# Patient Record
Sex: Male | Born: 1990 | Race: White | Hispanic: No | Marital: Married
Health system: Southern US, Community
[De-identification: ages and names within clinical notes are randomized; demographics above are authoritative.]

---

## 2010-07-27 ENCOUNTER — Ambulatory Visit: Payer: Self-pay | Admitting: General Surgery

## 2011-02-26 ENCOUNTER — Encounter: Payer: Self-pay | Admitting: Family Medicine

## 2011-02-26 ENCOUNTER — Ambulatory Visit
Admission: RE | Admit: 2011-02-26 | Discharge: 2011-02-26 | Disposition: A | Discharge status: Home / Self Care | Payer: Self-pay | Source: Ambulatory Visit | Attending: Family Medicine | Admitting: Family Medicine

## 2011-02-26 ENCOUNTER — Other Ambulatory Visit: Payer: Self-pay | Admitting: Family Medicine

## 2011-02-26 ENCOUNTER — Inpatient Hospital Stay (INDEPENDENT_AMBULATORY_CARE_PROVIDER_SITE_OTHER)
Admission: RE | Admit: 2011-02-26 | Discharge: 2011-02-26 | Disposition: A | Discharge status: Home / Self Care | Payer: Self-pay | Source: Ambulatory Visit | Attending: Family Medicine | Admitting: Family Medicine

## 2011-02-26 DIAGNOSIS — K219 Gastro-esophageal reflux disease without esophagitis: Secondary | ICD-10-CM | POA: Insufficient documentation

## 2011-02-26 DIAGNOSIS — S93609A Unspecified sprain of unspecified foot, initial encounter: Secondary | ICD-10-CM

## 2011-02-26 DIAGNOSIS — S93409A Sprain of unspecified ligament of unspecified ankle, initial encounter: Secondary | ICD-10-CM

## 2011-02-26 DIAGNOSIS — S99929A Unspecified injury of unspecified foot, initial encounter: Secondary | ICD-10-CM

## 2011-02-26 DIAGNOSIS — F3289 Other specified depressive episodes: Secondary | ICD-10-CM | POA: Insufficient documentation

## 2011-02-26 DIAGNOSIS — F329 Major depressive disorder, single episode, unspecified: Secondary | ICD-10-CM | POA: Insufficient documentation

## 2011-02-26 DIAGNOSIS — S99919A Unspecified injury of unspecified ankle, initial encounter: Secondary | ICD-10-CM

## 2011-02-28 ENCOUNTER — Telehealth (INDEPENDENT_AMBULATORY_CARE_PROVIDER_SITE_OTHER): Payer: Self-pay | Admitting: Emergency Medicine

## 2011-10-18 NOTE — Letter (Signed)
Summary: Out of Work  [facility]  [address]   [place], [place] [address]   Phone: [phone]  Fax: [phone]    February 26, 2011   Employee:  Cody Huerta    To Whom It May Concern:   For Medical reasons, please excuse the above named employee from work through 03/07/11.      If you need additional information, please feel free to contact our office.         Sincerely,    Donna Christen MD

## 2011-10-18 NOTE — Progress Notes (Signed)
Summary: LEFT FOOT POSSIBLE FRATURE? Room 5   Vital Signs:  Patient Profile:   20 Years Old Male CC:      Left foot injury after jumping in dumpster on Wednesday Height:     76 inches Weight:      160 pounds O2 Sat:      99 % O2 treatment:    Room Air Temp:     97.8 degrees F oral Pulse rate:   73 / minute Pulse rhythm:   regular Resp:     16 per minute BP sitting:   124 / 81  (left arm) Cuff size:   regular  Pt. in pain?   yes    Location:   foot    Intensity:   9    Type:       sharp  Vitals Entered By: Emilio Math (February 26, 2011 11:40 AM)                   Current Allergies: ! SULFAHistory of Present Illness Chief Complaint: Left foot injury after jumping in dumpster on Wednesday History of Present Illness:  Subjective:  Patient complains of pain in his left foot and ankle after twisting his foot on a board.  He has pain with weight bearing  REVIEW OF SYSTEMS Constitutional Symptoms      Denies fever, chills, night sweats, weight loss, weight gain, and fatigue.  Eyes       Denies change in vision, eye pain, eye discharge, glasses, contact lenses, and eye surgery. Ear/Nose/Throat/Mouth       Denies hearing loss/aids, change in hearing, ear pain, ear discharge, dizziness, frequent runny nose, frequent nose bleeds, sinus problems, sore throat, hoarseness, and tooth pain or bleeding.  Respiratory       Denies dry cough, productive cough, wheezing, shortness of breath, asthma, bronchitis, and emphysema/COPD.  Cardiovascular       Denies murmurs, chest pain, and tires easily with exhertion.    Gastrointestinal       Denies stomach pain, nausea/vomiting, diarrhea, constipation, blood in bowel movements, and indigestion. Genitourniary       Denies painful urination, kidney stones, and loss of urinary control. Neurological       Denies paralysis, seizures, and fainting/blackouts. Musculoskeletal       Complains of muscle pain, joint pain, joint stiffness,  decreased range of motion, and swelling.      Denies redness, muscle weakness, and gout.  Skin       Denies bruising, unusual mles/lumps or sores, and hair/skin or nail changes.  Psych       Denies mood changes, temper/anger issues, anxiety/stress, speech problems, depression, and sleep problems.  Past History:  Past Medical History: Hearing loss Depression GERD  Past Surgical History: Gastroschesis repair  Family History: Father, D, Heart Disease, Depression, HTN, Diabetes Mother, Depression  Social History: Non smoker No ETOH No DRugs Works for [employer], Consulting civil engineer   Objective:  Appearance:  Patient appears healthy, stated age, and in no acute distress  Left ankle:  Decreased range of motion.  Tenderness and mild swelling over the lateral malleolus.  Joint stable.  There is mild tenderness over the base of the fifth  metatarsal.   Left foot:  Decreased range of motion toes.  No deformity or swelling.  Mild tenderness over distal metatarsals.  Decreased sensation to touch but good cap refill. Left foot X-ray:  Negative Left ankle X-ray:  Negative Assessment New Problems: FOOT SPRAIN, LEFT (ICD-845.10) ANKLE SPRAIN,  LEFT (ICD-845.00) ANKLE INJURY, LEFT (ICD-959.7) FOOT INJURY, LEFT (ICD-959.7) GERD (ICD-530.81) DEPRESSION (ICD-311)   Plan New Orders: T-DG Ankle Complete*L* [73610] T-DG Foot Complete*L* [73630] New Patient Level III [99203] Aircast Ankle Brace [L4350] Ace Wraps 3-5 in/yard  [A6449] Crutches [E0110] Crutches fitting and training [97760] Planning Comments:   Continue to apply ice pack for 30 minutes every 1 to 2 hours today and tomorrow.  Elevate.  Use crutches for 3 to 5 days.  Wear Ace wrap until swelling decreases.  Wear brace for about 2 to 3 weeks.  Begin exercises in about 5 days as per instruction sheet ([facility] information and instruction patient handoust given)  [facility] with [facility] if not improving 10 to 14 days.   The  patient and/or caregiver has been counseled thoroughly with regard to medications prescribed including dosage, schedule, interactions, rationale for use, and possible side effects and they verbalize understanding.  Diagnoses and expected course of recovery discussed and will return if not improved as expected or if the condition worsens. Patient and/or caregiver verbalized understanding.   Orders Added: 1)  T-DG Ankle Complete*L* [73610] 2)  T-DG Foot Complete*L* [73630] 3)  New Patient Level III [16109] 4)  Aircast Ankle Brace [L4350] 5)  Ace Wraps 3-5 in/yard  [A6449] 6)  Crutches [E0110] 7)  Crutches fitting and training [phone]

## 2011-10-18 NOTE — Telephone Encounter (Signed)
  Phone Note Outgoing Call Call back at [facility] Phone [phone]   Call placed by: Emilio Math,  February 28, 2011 3:22 PM Call placed to: Patient Summary of Call: Left msg hope his ankle and foot are better

## 2015-07-10 ENCOUNTER — Other Ambulatory Visit: Payer: Self-pay | Admitting: Adult Health

## 2015-07-10 ENCOUNTER — Ambulatory Visit (INDEPENDENT_AMBULATORY_CARE_PROVIDER_SITE_OTHER): Payer: Self-pay

## 2015-07-10 DIAGNOSIS — T1490XA Injury, unspecified, initial encounter: Secondary | ICD-10-CM

## 2015-07-10 DIAGNOSIS — M25562 Pain in left knee: Secondary | ICD-10-CM

## 2018-10-18 ENCOUNTER — Ambulatory Visit (INDEPENDENT_AMBULATORY_CARE_PROVIDER_SITE_OTHER): Payer: Self-pay

## 2018-10-18 ENCOUNTER — Other Ambulatory Visit: Payer: Self-pay | Admitting: Gerontology

## 2018-10-18 DIAGNOSIS — M25521 Pain in right elbow: Secondary | ICD-10-CM

## 2018-10-18 DIAGNOSIS — M25511 Pain in right shoulder: Secondary | ICD-10-CM

## 2018-10-18 DIAGNOSIS — R52 Pain, unspecified: Secondary | ICD-10-CM

## 2019-09-20 ENCOUNTER — Other Ambulatory Visit: Payer: Self-pay

## 2019-09-20 ENCOUNTER — Emergency Department (INDEPENDENT_AMBULATORY_CARE_PROVIDER_SITE_OTHER)
Admission: EM | Admit: 2019-09-20 | Discharge: 2019-09-20 | Disposition: A | Discharge status: Home / Self Care | Payer: BC Managed Care – PPO | Source: Home / Self Care

## 2019-09-20 DIAGNOSIS — J36 Peritonsillar abscess: Secondary | ICD-10-CM | POA: Diagnosis not present

## 2019-09-20 DIAGNOSIS — J029 Acute pharyngitis, unspecified: Secondary | ICD-10-CM

## 2019-09-20 DIAGNOSIS — H9201 Otalgia, right ear: Secondary | ICD-10-CM | POA: Diagnosis not present

## 2019-09-20 MED ORDER — CLINDAMYCIN HCL 300 MG PO CAPS: 300.0000 mg | ORAL_CAPSULE | Freq: Four times a day (QID) | ORAL | 0 refills | Status: AC

## 2019-09-20 MED ORDER — METHYLPREDNISOLONE SODIUM SUCC 40 MG IJ SOLR
80.0000 mg | Freq: Once | INTRAMUSCULAR | Status: AC
  Administered 2019-09-20: 80 mg via INTRAMUSCULAR

## 2019-09-20 MED ORDER — PREDNISONE 50 MG PO TABS: 50.0000 mg | ORAL_TABLET | Freq: Every day | ORAL | 0 refills | Status: AC

## 2019-09-20 NOTE — ED Provider Notes (Signed)
[name]    CSN: [id number] Arrival date & time: 09/20/19  1519      History   Chief Complaint Chief Complaint  Patient presents with  . Sore Throat  . Headache  . Otalgia    RT    HPI Cody Huerta is a 28 y.o. male.   HPI Cody Huerta is a 28 y.o. male presenting to UC with c/o 2 days of worsening Right ear pain and sore throat.  Pain is sharp, worse when swallowing, severe at times.  Associated generalized HA.  He has taken mucinex and OTC throat lozenges with mild relief. Denies fever, chills, n/v/d.  No known sick contacts or recent travel. He has had strep in the past.  He is not concerned for Covid.   History reviewed. No pertinent past medical history.  Patient Active Problem List   Diagnosis Date Noted  . DEPRESSION 02/26/2011  . GERD 02/26/2011    History reviewed. No pertinent surgical history.     Home Medications    Prior to Admission medications   Medication Sig Start Date End Date Taking? Authorizing Provider  hyoscyamine (LEVSIN) 0.125 MG tablet Take by mouth. 01/12/19  Yes [provider]  omeprazole (PRILOSEC) 40 MG capsule Take by mouth. 01/07/19  Yes [provider]  promethazine (PHENERGAN) 12.5 MG tablet Take by mouth. 03/29/18  Yes [provider]  clindamycin (CLEOCIN) 300 MG capsule Take 1 capsule (300 mg total) by mouth 4 (four) times daily. X 7 days 09/20/19   Noe Gens, PA-C  orphenadrine (NORFLEX) 100 MG tablet Take 100 mg by mouth 2 (two) times daily. 04/23/19   [provider]  predniSONE (DELTASONE) 50 MG tablet Take 1 tablet (50 mg total) by mouth daily with breakfast for 5 days. 09/20/19 09/25/19  Noe Gens, PA-C    Family History History reviewed. No pertinent family history.  Social History Social History   Tobacco Use  . Smoking status: Not on file  . Smokeless tobacco: Never Used  Substance Use Topics  . Alcohol use: Not on file  . Drug use: Not on file     Allergies    Sulfonamide derivatives   Review of Systems Review of Systems  Constitutional: Negative for chills and fever.  HENT: Positive for ear pain (Right) and sore throat. Negative for congestion, trouble swallowing and voice change.   Respiratory: Negative for cough and shortness of breath.   Cardiovascular: Negative for chest pain and palpitations.  Gastrointestinal: Negative for abdominal pain, diarrhea, nausea and vomiting.  Musculoskeletal: Negative for arthralgias, back pain and myalgias.  Skin: Negative for rash.  Neurological: Positive for headaches. Negative for dizziness and light-headedness.     Physical Exam Triage Vital Signs ED Triage Vitals  Enc Vitals Group     BP 09/20/19 1610 134/89     Pulse Rate 09/20/19 1610 82     Resp 09/20/19 1610 18     Temp 09/20/19 1610 98.9 F (37.2 C)     Temp Source 09/20/19 1610 Oral     SpO2 09/20/19 1610 97 %     Weight --      Height --      Head Circumference --      Peak Flow --      Pain Score 09/20/19 1611 4     Pain Loc --      Pain Edu? --      Excl. in [place]? --    No data found.  Updated Vital Signs BP 134/89 (BP Location: Right Arm)   Pulse 82   Temp 98.9 F (37.2 C) (Oral)   Resp 18   SpO2 97%   Visual Acuity Right Eye Distance:   Left Eye Distance:   Bilateral Distance:    Right Eye Near:   Left Eye Near:    Bilateral Near:     Physical Exam Vitals signs and nursing note reviewed.  Constitutional:      Appearance: He is well-developed.  HENT:     Head: Normocephalic and atraumatic.     Right Ear: Tympanic membrane and ear canal normal.     Left Ear: Tympanic membrane and ear canal normal.     Nose: Nose normal.     Right Sinus: No maxillary sinus tenderness or frontal sinus tenderness.     Left Sinus: No maxillary sinus tenderness or frontal sinus tenderness.     Mouth/Throat:     Lips: Pink.     Mouth: Mucous membranes are moist.     Pharynx: Uvula midline. Posterior oropharyngeal erythema  present. No uvula swelling.     Tonsils: Tonsillar exudate and tonsillar abscess (Right side) present. 3+ on the right. 2+ on the left.  Neck:     Musculoskeletal: Normal range of motion and neck supple.  Cardiovascular:     Rate and Rhythm: Normal rate and regular rhythm.  Pulmonary:     Effort: Pulmonary effort is normal. No respiratory distress.     Breath sounds: Normal breath sounds. No stridor.  Musculoskeletal: Normal range of motion.  Lymphadenopathy:     Cervical: Cervical adenopathy (submandibular tenderness on Right side) present.  Skin:    General: Skin is warm and dry.  Neurological:     Mental Status: He is alert and oriented to person, place, and time.  Psychiatric:        Behavior: Behavior normal.      UC Treatments / Results  Labs (all labs ordered are listed, but only abnormal results are displayed) Labs Reviewed  STREP A DNA PROBE  POCT RAPID STREP A (OFFICE)    EKG   Radiology No results found.  Procedures Procedures (including critical care time)  Medications Ordered in UC Medications  methylPREDNISolone sodium succinate (SOLU-MEDROL) 40 mg/mL injection 80 mg (80 mg Intramuscular Given 09/20/19 1707)    Initial Impression / Assessment and Plan / UC Course  I have reviewed the triage vital signs and the nursing notes.  Pertinent labs & imaging results that were available during my care of the patient were reviewed by me and considered in my medical decision making (see chart for details).    Rapid strep: NEGATIVE Exam concerning for tonsillar abscess Will start pt on prednisone and clindamycin AVS provided.  Final Clinical Impressions(s) / UC Diagnoses   Final diagnoses:  Tonsillar abscess  Acute otalgia, right  Acute pharyngitis, unspecified etiology     Discharge Instructions     You were given a shot of solumedrol (a steroid) today to help with inflammation in your tonsils and pain.  You have been prescribed 5 days of  prednisone, an oral steroid.  You may start this medication tomorrow with breakfast.    Please take antibiotics as prescribed and be sure to complete entire course even if you start to feel better to ensure infection does not come back.  You may take 500mg  acetaminophen every 4-6 hours or in combination with ibuprofen 400-600mg  every 6-8 hours as needed for pain, inflammation, and fever.  Be sure to well hydrated with clear liquids and get at least 8 hours of sleep at night, preferably more while sick.   Please follow up with family medicine in 1 week if needed.      ED Prescriptions    Medication Sig Dispense Auth. Provider   clindamycin (CLEOCIN) 300 MG capsule Take 1 capsule (300 mg total) by mouth 4 (four) times daily. X 7 days 28 capsule Doroteo Glassman, Aris Moman O, PA-C   predniSONE (DELTASONE) 50 MG tablet Take 1 tablet (50 mg total) by mouth daily with breakfast for 5 days. 5 tablet Lurene Shadow, PA-C     PDMP not reviewed this encounter.   Lurene Shadow, [place] 09/21/19 1107

## 2019-09-20 NOTE — Discharge Instructions (Signed)
You were given a shot of solumedrol (a steroid) today to help with inflammation in your tonsils and pain.  You have been prescribed 5 days of prednisone, an oral steroid.  You may start this medication tomorrow with breakfast.    Please take antibiotics as prescribed and be sure to complete entire course even if you start to feel better to ensure infection does not come back.  You may take 500mg  acetaminophen every 4-6 hours or in combination with ibuprofen 400-600mg  every 6-8 hours as needed for pain, inflammation, and fever.  Be sure to well hydrated with clear liquids and get at least 8 hours of sleep at night, preferably more while sick.   Please follow up with family medicine in 1 week if needed.

## 2019-09-20 NOTE — ED Triage Notes (Signed)
Pt c/o sore throat, headache, and RT ear pain since Tues. Gets sharp pain in ear when swallow. No known COVID exposure. Has had strep in past. Taking mucinex and using OTC drops prn.

## 2019-09-21 ENCOUNTER — Telehealth: Payer: Self-pay | Admitting: Emergency Medicine

## 2019-09-21 LAB — STREP A DNA PROBE: Group A Strep Probe: NOT DETECTED

## 2019-09-21 NOTE — Telephone Encounter (Signed)
Patient states he did not get rxs until today but he felt better after his visit and injection here last evening; already easier to swallow.

## 2020-04-21 IMAGING — DX DG SHOULDER 2+V*R*
3 series · 3 of 3 positions shown · non-contrast
Comparison: None.

CLINICAL DATA: Pain and decreased range of motion.

EXAM:
RIGHT SHOULDER - 2+ VIEW

[shoulder grashey]
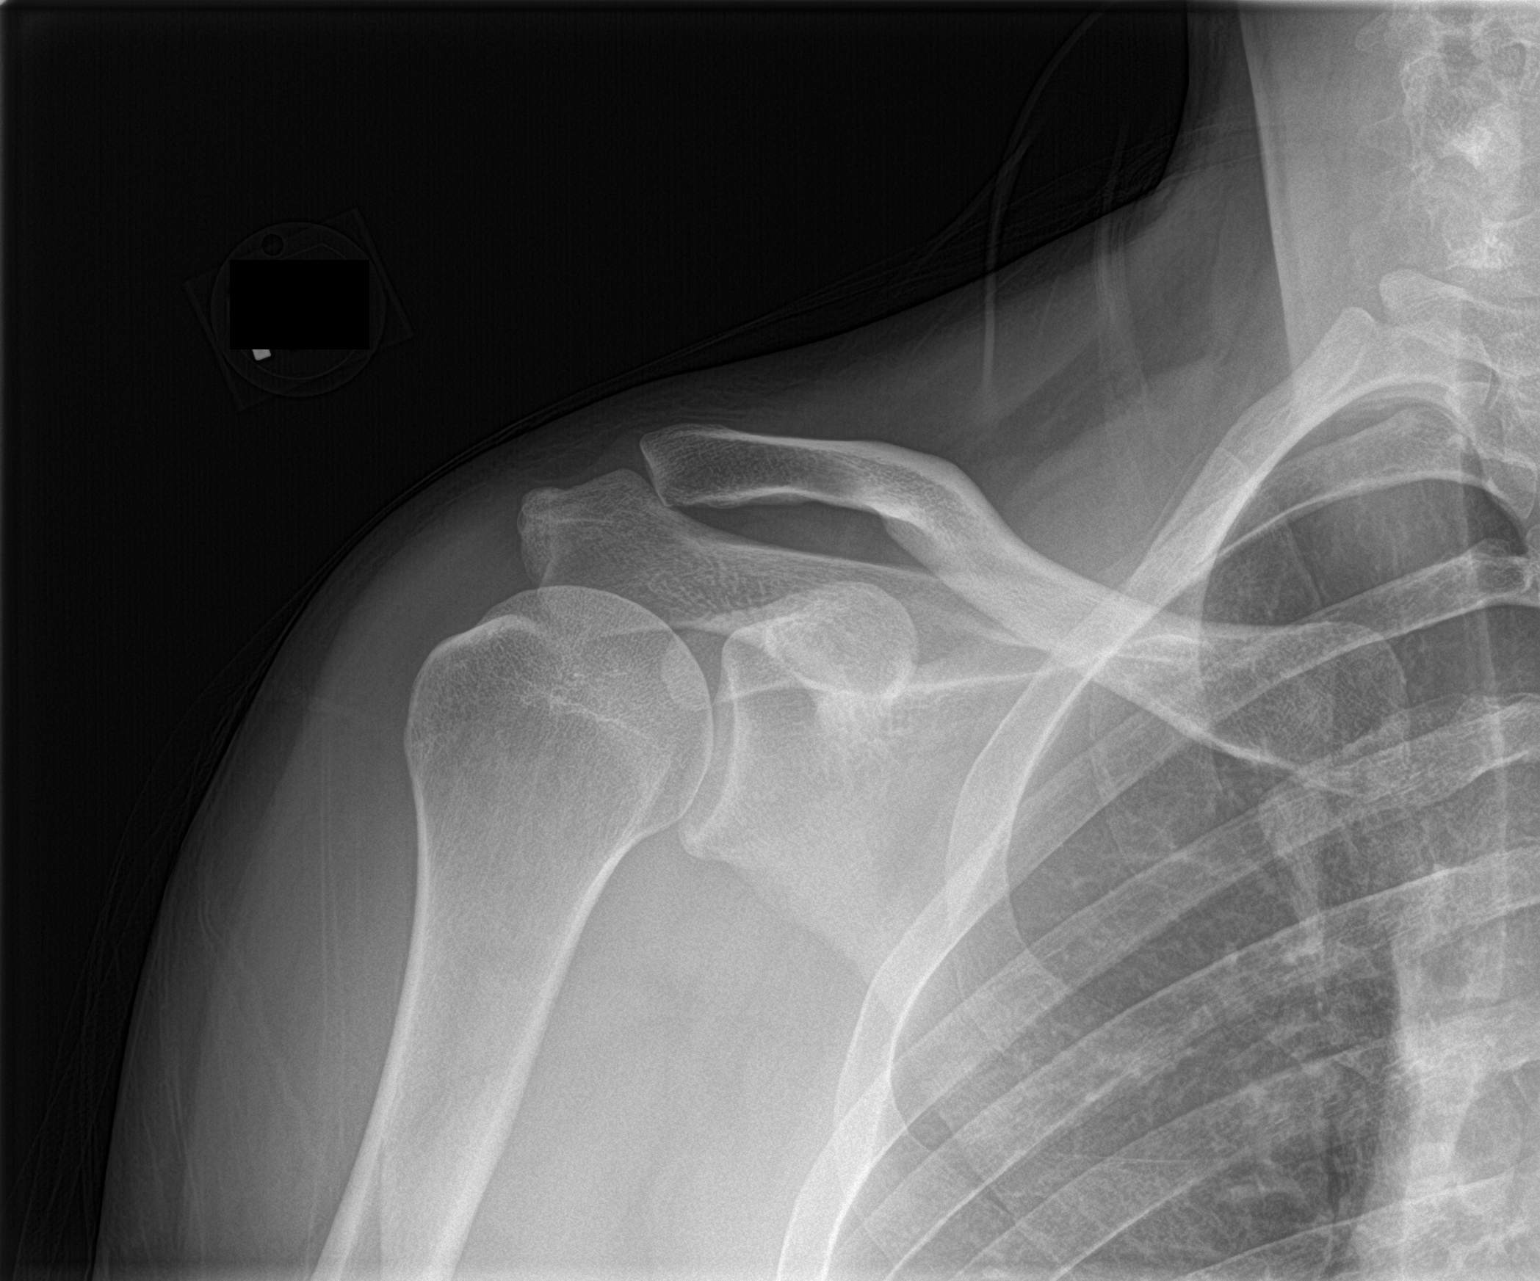

[shoulder y view]
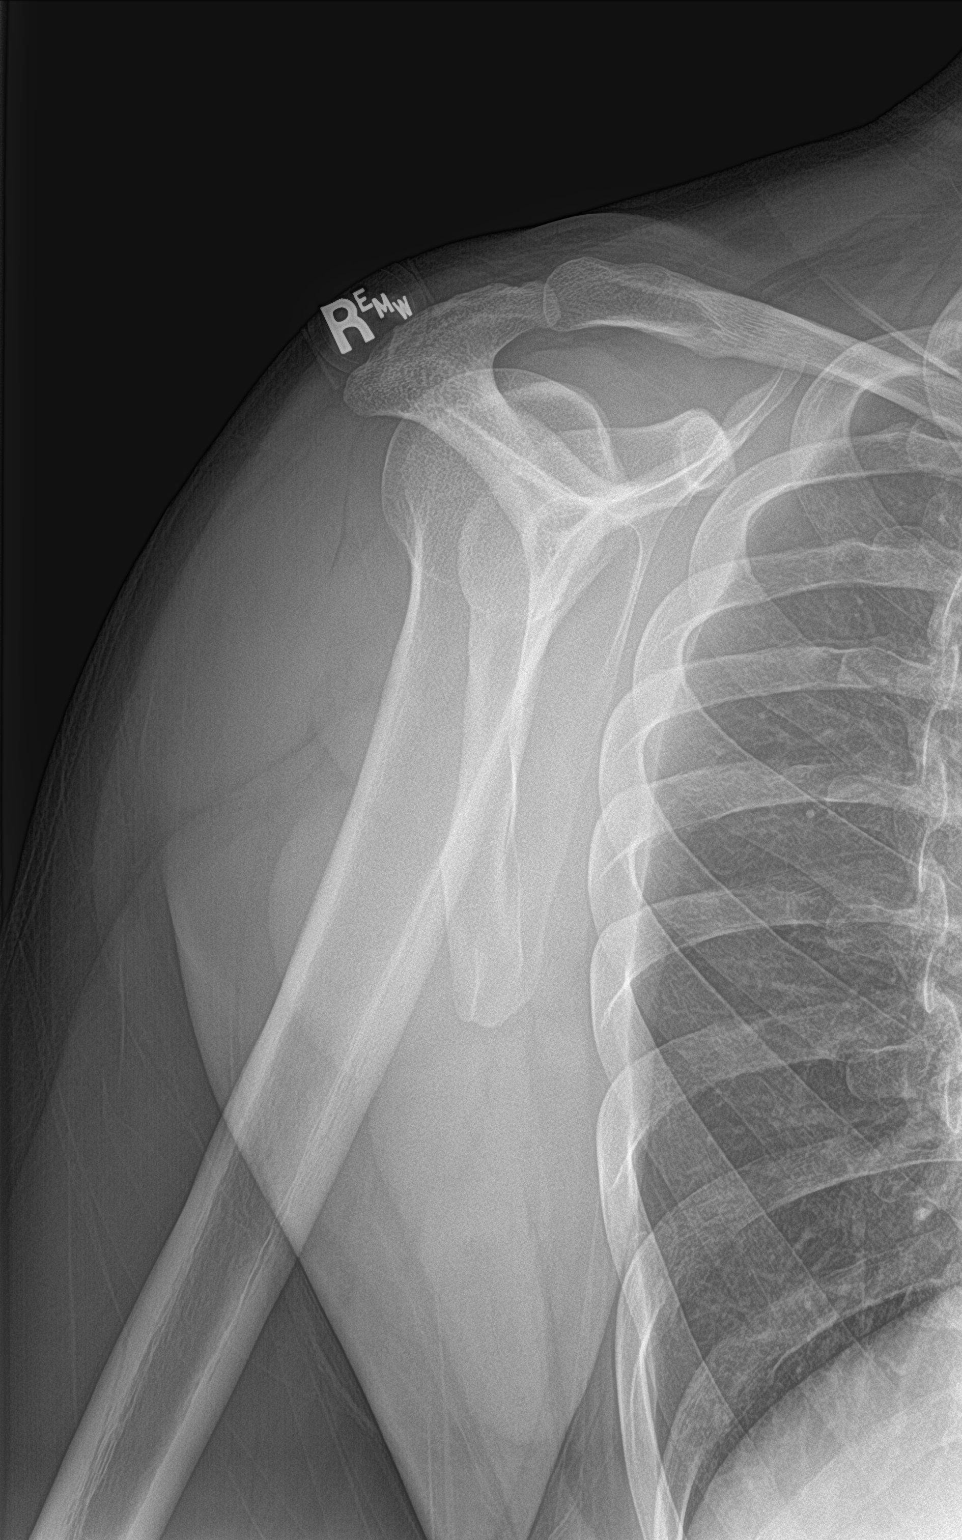

[shoulder ap neutral]
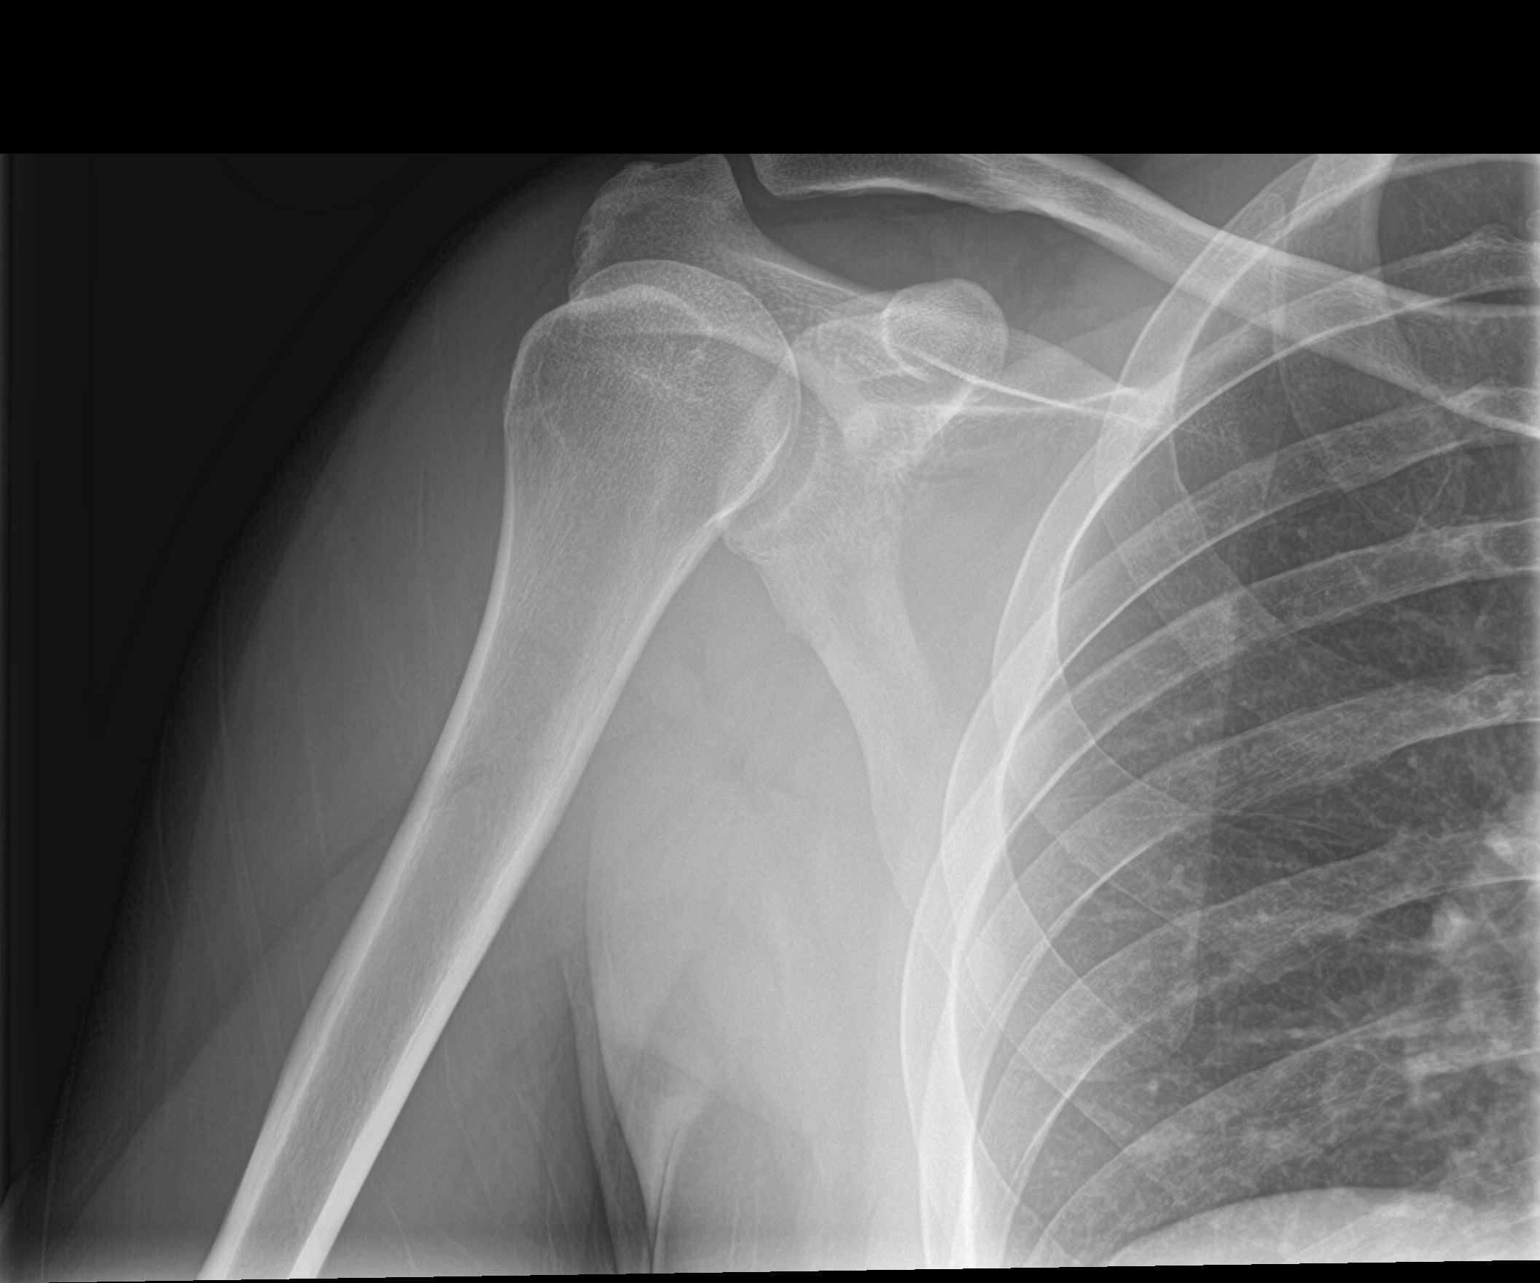

[3 of 3 positions shown; findings below may reference images not displayed]

FINDINGS: There is no evidence of fracture or dislocation. There is no
evidence of arthropathy or other focal bone abnormality. Soft
tissues are unremarkable.
IMPRESSION: Negative.

## 2024-02-22 ENCOUNTER — Ambulatory Visit

## 2024-02-22 NOTE — Therapy (Signed)
 Patient would benefit from pelvic health for abdominal/pelvic dysfunction.  Candi Leash, PT, DPT

## 2024-02-28 ENCOUNTER — Ambulatory Visit (HOSPITAL_COMMUNITY): Attending: Nurse Practitioner | Admitting: Physical Therapy

## 2024-02-28 ENCOUNTER — Other Ambulatory Visit: Payer: Self-pay

## 2024-02-28 DIAGNOSIS — R2689 Other abnormalities of gait and mobility: Secondary | ICD-10-CM | POA: Diagnosis present

## 2024-02-28 DIAGNOSIS — R1084 Generalized abdominal pain: Secondary | ICD-10-CM | POA: Diagnosis present

## 2024-02-28 DIAGNOSIS — M6281 Muscle weakness (generalized): Secondary | ICD-10-CM | POA: Insufficient documentation

## 2024-02-28 NOTE — Therapy (Signed)
 OUTPATIENT PHYSICAL THERAPY  EVALUATION   Patient Name: Cody Huerta MRN: [id number] DOB:1991/03/09, 33 y.o., male Today's Date: 02/28/2024  END OF SESSION:  PT End of Session - 02/28/24 1547     Visit Number 1    Number of Visits 12    Date for PT Re-Evaluation 04/10/24    Authorization Type BC[employer]    PT Start Time 1445    PT Stop Time 1535    PT Time Calculation (min) 50 min    Activity Tolerance Patient limited by pain    Behavior During Therapy Restless             No past medical history on file. No past surgical history on file. Patient Active Problem List   Diagnosis Date Noted   DEPRESSION 02/26/2011   GERD 02/26/2011    PCP: Lavinia Sharps, NP  REFERRING PROVIDER: Lavinia Sharps, NP  REFERRING DIAG: [phone] (ICD-10-CM) - Chronic abdominal pain G89.29 (ICD-10-CM) - Other chronic pain G90.59 (ICD-10-CM) - Complex regional pain syndrome type 1 affecting other site R52 (ICD-10-CM) - Pain, unspecified  THERAPY DIAG:  Generalized abdominal pain  Decreased spinal mobility  Muscle weakness (generalized)  Rationale for Evaluation and Treatment: Rehabilitation  ONSET DATE: 06/2023  SUBJECTIVE:                                                                                                                                                                                           SUBJECTIVE STATEMENT: 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  He had surgery to correct it and he has had some pain ever since  He had surgery in August of 2024, to correct a mild rotation of his intestines, repaired two hernia and removed his appendix.  He went home but was in so much pain that he had to go back.  He had a second surgery for a mesh to support the intestines.  And now he is having constant pain.  Before his first surgery he would hurt now and then and now he is constantly hurting.  This has affected every aspect of his life.  He has  not worked in five months.  He wears an abdominal band to assist with the pain. It hurts when he walks, when he breaths and when he sits down.  He does not get any more than 4 hours of pain a day.  He can only lie on his RT side. The pain encompasses his whole stomach area.  PAIN:  Are you having pain? Yes NPRS scale: 8/10 will go to a 10/10 Pain location: Deep and Anterior  Pain type: sharp, throbbing, and tight Pain description: constant   Aggravating factors: walking  Relieving factors: in his recliner, not fully extended   PRECAUTIONS: None  RED FLAGS: None   WEIGHT BEARING RESTRICTIONS: No  FALLS:  Has patient fallen in last 6 months? 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   COGNITION: Overall cognitive status: Within functional limits for tasks assessed     FUNCTIONAL TESTS:  30 seconds chair stand test:unable to come sit to stand without using UE:  3x  2 minute walk test: 226 antalgic    POSTURE: rounded shoulders, decreased lumbar lordosis, and decreased thoracic kyphosis  PELVIC ALIGNMENT:  LUMBARAROM/PROM:  A/PROM A/PROM  eval  Flexion Fingers to distal thighs  increase pain   Extension 13 increased pain   Right lateral flexion 15  Left lateral flexion 8  Right rotation   Left rotation    (Blank rows = not tested)   LOWER EXTREMITY MMT:  MMT Right eval Left eval  Hip flexion    Hip extension    Hip abduction    Hip  adduction    Hip internal rotation    Hip external rotation    Knee flexion    Knee extension    Ankle dorsiflexion    Ankle plantarflexion    Ankle inversion    Ankle eversion      TODAY'S TREATMENT:                                                                                                                              DATE: 02/28/24  EVAL  Supine ab set x 3 Standing back extension x 5 Standing side bend x 5 Scapular retraction x 5    PATIENT EDUCATION:  Education details: hep Person educated: Patient Education method: Chief Technology Officer Education comprehension: verbalized understanding and verbal cues required  HOME EXERCISE PROGRAM: Access Code: [email or web address] URL: [email or web address] Date: 02/28/2024 Prepared by: Leodis Rainwater  Exercises - Standing Lumbar Extension  - 2 x daily - 7 x weekly - 1 sets - 5-10 reps - 3 seconds  hold - TL Sidebending Stretch - Single Arm Overhead  - 2 x daily - 7 x weekly - 1 sets - 5-10 reps - 3 seconds  hold - Supine Transversus Abdominis Bracing - Hands on Stomach  - 2 x daily - 7 x weekly - 1 sets - 10 reps -  5" hold - Seated Scapular Retraction  - 2 x daily - 7 x weekly - 1 sets - 10 reps - 5" hold  ASSESSMENT:  CLINICAL IMPRESSION: Patient is a 32 y.o. Male who was seen today for physical therapy evaluation and treatment for abdominal pain.  Evaluation demonstrates decreased ROM, decreased strength, decreased mobility, increased pain.  Mr. Dogan will benefit from skilled PT to address these issues and maximize his functional ability.   OBJECTIVE IMPAIRMENTS: Abnormal gait, decreased activity tolerance, decreased mobility, difficulty walking, decreased ROM, decreased strength, increased edema, increased fascial restrictions, impaired perceived functional ability, increased muscle spasms, impaired flexibility, postural dysfunction, and pain.   ACTIVITY LIMITATIONS: carrying, lifting, bending, standing, squatting,  sleeping, stairs, bed mobility, dressing, hygiene/grooming, and locomotion level  PARTICIPATION LIMITATIONS: meal prep, cleaning, laundry, interpersonal relationship, driving, shopping, community activity, occupation, yard work, and school  PERSONAL FACTORS: Behavior pattern, Fitness, and Time since onset of injury/illness/exacerbation are also affecting patient's functional outcome.   REHAB POTENTIAL: Fair    CLINICAL DECISION MAKING: Evolving/moderate complexity  EVALUATION COMPLEXITY: Moderate   GOALS: Goals reviewed with patient? No  SHORT TERM GOALS: Target date: 03/20/24  Pt to be I in HEP in order to decrease his pain to no greater than a 6/10 Baseline: Goal status: INITIAL  2.  PT core strength increased to be able to come sit to stand with increased ease as noted by being able to complete 6 /30 seconds  Baseline:  Goal status: INITIAL  Goal status: INITIAL  LONG TERM GOALS: Target date: 04/10/24 04/10/24 Pt to be I in an advanced HEP in order to decrease his pain to no greater than a 4/10 Baseline:  Goal status: INITIAL  2.  PT to no longer be hurting 24/7 Baseline:  Goal status: INITIAL  3.   PT core strength increased to be able to come sit to stand with increased ease as noted by being able to complete 10 /30 seconds  Baseline:  Goal status: INITIAL  4.  PT to be able to go to his upstairs without having to rest  Baseline:  Goal status: INITIAL  5.  PT able to walk 326 feet in a two minuet period of time to improve functional ability.  Baseline:  Goal status: INITIAL    PLAN:  PT FREQUENCY: 2x/week  PT DURATION: 6 weeks  PLANNED INTERVENTIONS: 97110-Therapeutic exercises, 97530- Therapeutic activity, [id number]- Neuromuscular re-education, 97535- Self Care, 16109- Manual therapy, and laser therapy  PLAN FOR NEXT SESSION: Laser for pain, myofascial techniques begin bridge and marching .  Leodis Rainwater, PT CLT [phone]  02/28/2024, 3:48 PM

## 2024-03-06 ENCOUNTER — Encounter (HOSPITAL_COMMUNITY): Admitting: Physical Therapy

## 2024-03-20 ENCOUNTER — Ambulatory Visit (HOSPITAL_COMMUNITY): Attending: Nurse Practitioner | Admitting: Physical Therapy

## 2024-03-20 DIAGNOSIS — R2689 Other abnormalities of gait and mobility: Secondary | ICD-10-CM | POA: Diagnosis present

## 2024-03-20 DIAGNOSIS — R1084 Generalized abdominal pain: Secondary | ICD-10-CM | POA: Diagnosis present

## 2024-03-20 DIAGNOSIS — M6281 Muscle weakness (generalized): Secondary | ICD-10-CM | POA: Insufficient documentation

## 2024-03-20 NOTE — Therapy (Signed)
 OUTPATIENT PHYSICAL THERAPY  Treatment   Patient Name: Cody Huerta MRN: [id number] DOB:26-Feb-1991, 33 y.o., male Today's Date: 03/20/2024  END OF SESSION:  PT End of Session - 03/20/24 1643     Visit Number 2    Number of Visits 12    Date for PT Re-Evaluation 04/10/24    Authorization Type [employer]    PT Start Time 1555    PT Stop Time 1633    PT Time Calculation (min) 38 min    Activity Tolerance Patient limited by pain    Behavior During Therapy Restless              No past medical history on file. No past surgical history on file. Patient Active Problem List   Diagnosis Date Noted   DEPRESSION 02/26/2011   GERD 02/26/2011    PCP: Brunson-Ollison, Joyce E, NP  REFERRING PROVIDER: Brunson-Ollison, Joyce E, NP  REFERRING DIAG: [phone] (ICD-10-CM) - Chronic abdominal pain G89.29 (ICD-10-CM) - Other chronic pain G90.59 (ICD-10-CM) - Complex regional pain syndrome type 1 affecting other site R52 (ICD-10-CM) - Pain, unspecified  THERAPY DIAG:  Generalized abdominal pain  Decreased spinal mobility  Muscle weakness (generalized)  Rationale for Evaluation and Treatment: Rehabilitation  ONSET DATE: 06/2023  SUBJECTIVE:                                                                                                                                                                                           SUBJECTIVE STATEMENT: Pt has been completing his exercises and can move better PAIN:  Are you having pain? Yes NPRS scale: 9/10 will go to a 10/10 Pain location: Deep and Anterior  Pain type: sharp, throbbing, and tight Pain description: constant   Aggravating factors: walking  Relieving factors: in his recliner, not fully extended   PRECAUTIONS: None  RED FLAGS: None   WEIGHT BEARING RESTRICTIONS: No  FALLS:  Has patient fallen in last 6 months? 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   COGNITION: Overall cognitive status: Within functional limits for tasks assessed  FUNCTIONAL TESTS:  30 seconds chair stand test:unable to come sit to stand without using UE:  3x  2 minute walk test: 226 antalgic    POSTURE: rounded shoulders, decreased lumbar lordosis, and decreased thoracic kyphosis  PELVIC ALIGNMENT:  LUMBARAROM/PROM:  A/PROM A/PROM  eval  Flexion Fingers to distal thighs  increase pain   Extension 13 increased pain   Right lateral flexion 15  Left lateral flexion 8  Right rotation   Left rotation    (Blank rows = not tested)  TODAY'S TREATMENT:                                                                                                                              DATE: 03/20/24   Laser 1:24 J/cm2  5 treatment to scar area on abdomen followed by myofascial release techniques and lymphedema techniques to assist in decreasing pain.   Supine ab set x 5 Bridge x5 Bent knee raise x 10 Nustep level 3 x 10 minutes.  PATIENT EDUCATION: 03/20/24: manual for deep abdominal lymph nodes  Education details: hep Person educated: Patient Education method: Explanation and Handouts Education comprehension: verbalized understanding and verbal cues required  HOME EXERCISE PROGRAM: Access Code: [email or web address] URL: [email or web address] Date: 02/28/2024 Prepared by: Leodis Rainwater  Exercises - Standing Lumbar Extension  - 2 x daily - 7 x weekly - 1 sets - 5-10 reps - 3 seconds  hold - TL  Sidebending Stretch - Single Arm Overhead  - 2 x daily - 7 x weekly - 1 sets - 5-10 reps - 3 seconds  hold - Supine Transversus Abdominis Bracing - Hands on Stomach  - 2 x daily - 7 x weekly - 1 sets - 10 reps - 5" hold - Seated Scapular Retraction  - 2 x daily - 7 x weekly - 1 sets - 10 reps - 5" hold 03/20/24   Supine Bridge  - 2 x daily - 7 x weekly - 1 sets - 10 reps - 5" hold - Hooklying Small March  - 1 x daily - 7 x weekly - 1 sets - 10 reps - 5" hold  Assessment:  CLINICAL IMPRESSION: Patient has been completing his HEP.  Pt pain remains high but mobility has improved.  Pt stomach area is extremely tight with noted keloid scaring.  Therapist began laser and manual in attempt to decease pain level.  Pt continues to have  decreased ROM, decreased strength, decreased mobility, increased pain.  Mr. Deis will benefit from skilled PT to address these issues and maximize his functional ability.   OBJECTIVE IMPAIRMENTS: Abnormal gait, decreased activity tolerance, decreased mobility, difficulty walking, decreased ROM, decreased strength, increased edema, increased fascial restrictions, impaired perceived functional ability, increased muscle spasms, impaired flexibility, postural dysfunction, and pain.   ACTIVITY LIMITATIONS: carrying, lifting, bending, standing, squatting, sleeping, stairs, bed mobility, dressing, hygiene/grooming, and locomotion level  PARTICIPATION LIMITATIONS: meal prep, cleaning, laundry, interpersonal relationship, driving, shopping, community activity, occupation,  yard work, and school  PERSONAL FACTORS: Behavior pattern, Fitness, and Time since onset of injury/illness/exacerbation are also affecting patient's functional outcome.   REHAB POTENTIAL: Fair    CLINICAL DECISION MAKING: Evolving/moderate complexity  EVALUATION COMPLEXITY: Moderate   GOALS: Goals reviewed with patient? No  SHORT TERM GOALS: Target date: 03/20/24  Pt to be I in HEP in order to decrease his  pain to no greater than a 6/10 Baseline: Goal status: on-going  2.  PT core strength increased to be able to come sit to stand with increased ease as noted by being able to complete 6 /30 seconds  Baseline:  Goal status: on-going   LONG TERM GOALS: Target date: 04/10/24 04/10/24 Pt to be I in an advanced HEP in order to decrease his pain to no greater than a 4/10 Baseline:  Goal status: on-going  2.  PT to no longer be hurting 24/7 Baseline:  Goal status:on-going  3.   PT core strength increased to be able to come sit to stand with increased ease as noted by being able to complete 10 /30 seconds  Baseline:  Goal status: on-going  4.  PT to be able to go to his upstairs without having to rest  Baseline:  Goal status: on-going  5.  PT able to walk 326 feet in a two minuet period of time to improve functional ability.  Baseline:  Goal status: on-going    PLAN:  PT FREQUENCY: 2x/week  PT DURATION: 6 weeks  PLANNED INTERVENTIONS: 97110-Therapeutic exercises, 97530- Therapeutic activity, [id number]- Neuromuscular re-education, 97535- Self Care, [id number]- Manual therapy, and laser therapy  PLAN FOR NEXT SESSION:  Continue Laser for pain, myofascial techniques begin standing marching and step up  Leodis Rainwater, PT CLT [phone]  03/20/2024, 4:44 PM

## 2024-03-27 ENCOUNTER — Encounter (HOSPITAL_COMMUNITY): Admitting: Physical Therapy

## 2024-04-03 ENCOUNTER — Encounter (HOSPITAL_COMMUNITY): Admitting: Physical Therapy

## 2024-04-03 ENCOUNTER — Encounter (HOSPITAL_COMMUNITY): Payer: Self-pay | Admitting: Physical Therapy

## 2024-04-03 NOTE — Therapy (Signed)
 PHYSICAL THERAPY DISCHARGE SUMMARY  Visits from Start of Care: 2  Current functional level related to goals / functional outcomes: Unknown as pt did not return    Remaining deficits: unknown   Education / Equipment: Self manual and exercises    Patient agrees to discharge. Patient goals were unknown. Patient is being discharged due to not returning since the last visit.  Leodis Rainwater, PT CLT [phone]

## 2024-04-10 ENCOUNTER — Encounter (HOSPITAL_COMMUNITY): Admitting: Physical Therapy

## 2024-04-17 ENCOUNTER — Encounter (HOSPITAL_COMMUNITY): Admitting: Physical Therapy
